# Patient Record
Sex: Female | Born: 1990 | Race: White | Hispanic: No | Marital: Single | Smoking: Never smoker
Health system: Southern US, Community
[De-identification: ages and names within clinical notes are randomized; demographics above are authoritative.]

---
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
## 2011-01-31 ENCOUNTER — Other Ambulatory Visit: Payer: Self-pay | Admitting: Family Medicine

## 2011-01-31 ENCOUNTER — Ambulatory Visit
Admission: RE | Admit: 2011-01-31 | Discharge: 2011-01-31 | Disposition: A | Discharge status: Home / Self Care | Payer: PRIVATE HEALTH INSURANCE | Source: Ambulatory Visit | Attending: Family Medicine | Admitting: Family Medicine

## 2011-01-31 DIAGNOSIS — R110 Nausea: Secondary | ICD-10-CM

## 2011-01-31 DIAGNOSIS — R1013 Epigastric pain: Secondary | ICD-10-CM

## 2011-05-13 LAB — COMPREHENSIVE METABOLIC PANEL
AST: 20 U/L (ref 0–37)
Albumin: 3.9 g/dL (ref 3.5–5.2)
Alkaline Phosphatase: 52 U/L (ref 47–119)
Chloride: 105 mEq/L (ref 96–112)
Potassium: 3.8 mEq/L (ref 3.5–5.1)
Sodium: 141 mEq/L (ref 135–145)
Total Bilirubin: 0.6 mg/dL (ref 0.3–1.2)

## 2011-05-13 LAB — URINALYSIS, ROUTINE W REFLEX MICROSCOPIC
Bilirubin Urine: NEGATIVE
Nitrite: NEGATIVE
Specific Gravity, Urine: 1.025 (ref 1.005–1.030)
pH: 6 (ref 5.0–8.0)

## 2011-05-13 LAB — DIFFERENTIAL
Band Neutrophils: 0 % (ref 0–10)
Basophils Absolute: 0 10*3/uL (ref 0.0–0.1)
Basophils Relative: 0 % (ref 0–1)
Eosinophils Absolute: 0.2 10*3/uL (ref 0.0–1.2)
Eosinophils Relative: 2 % (ref 0–5)
Lymphs Abs: 2.7 10*3/uL (ref 1.1–4.8)
Myelocytes: 0 %
Promyelocytes Absolute: 0 %

## 2011-05-13 LAB — CBC
Platelets: 416 10*3/uL — ABNORMAL HIGH (ref 150–400)
WBC: 9.2 10*3/uL (ref 4.5–13.5)

## 2011-05-13 LAB — OCCULT BLOOD X 1 CARD TO LAB, STOOL: Fecal Occult Bld: POSITIVE

## 2011-05-13 LAB — POCT PREGNANCY, URINE: Preg Test, Ur: NEGATIVE

## 2012-10-15 ENCOUNTER — Ambulatory Visit (INDEPENDENT_AMBULATORY_CARE_PROVIDER_SITE_OTHER): Payer: 59 | Admitting: Physician Assistant

## 2012-10-15 VITALS — BP 104/80 | HR 79 | Temp 98.2°F | Resp 16 | Ht 61.5 in | Wt 148.0 lb

## 2012-10-15 DIAGNOSIS — J3489 Other specified disorders of nose and nasal sinuses: Secondary | ICD-10-CM

## 2012-10-15 DIAGNOSIS — R0981 Nasal congestion: Secondary | ICD-10-CM

## 2012-10-15 DIAGNOSIS — R05 Cough: Secondary | ICD-10-CM

## 2012-10-15 DIAGNOSIS — J029 Acute pharyngitis, unspecified: Secondary | ICD-10-CM

## 2012-10-15 DIAGNOSIS — R059 Cough, unspecified: Secondary | ICD-10-CM

## 2012-10-15 LAB — POCT RAPID STREP A (OFFICE): Rapid Strep A Screen: NEGATIVE

## 2012-10-15 MED ORDER — IPRATROPIUM BROMIDE 0.03 % NA SOLN: 2.0000 | Freq: Two times a day (BID) | NASAL | Status: DC

## 2012-10-15 MED ORDER — AMOXICILLIN 875 MG PO TABS: 875.0000 mg | ORAL_TABLET | Freq: Two times a day (BID) | ORAL | Status: DC

## 2012-10-15 NOTE — Progress Notes (Signed)
  Subjective:    Patient ID: Kristen Collier, female    DOB: 10/16/1990, 22 y.o.   MRN: [id number]  HPI 22 year old female presents with 4 day history of sore throat, nasal congestion, cough, and left ear pain.  States symptoms have progressively worsened over the weekend and her cough has continued to irritate her throat.  Denies fever, chills, nausea, vomiting, headache, dizziness, or sinus pain.  She does have a history of strep throat infections, last one was 1 year ago. No known strep contacts.  No OTC medications tried yet.    Patient otherwise healthy with no other concerns today Takes OCP's as only daily medication.    She is a Consulting civil engineer at [employer].     Review of Systems  Constitutional: Negative for fever and chills.  HENT: Positive for ear pain (left sided), congestion, sore throat, rhinorrhea and postnasal drip.   Respiratory: Positive for cough. Negative for shortness of breath and wheezing.   Gastrointestinal: Negative for nausea, vomiting and abdominal pain.  Neurological: Negative for dizziness and headaches.       Objective:   Physical Exam  Constitutional: She is oriented to person, place, and time. She appears well-developed and well-nourished.  HENT:  Head: Normocephalic and atraumatic.  Right Ear: Hearing, tympanic membrane, external ear and ear canal normal.  Left Ear: Hearing, tympanic membrane, external ear and ear canal normal.  Mouth/Throat: Uvula is midline and mucous membranes are normal. Posterior oropharyngeal erythema present. No oropharyngeal exudate, posterior oropharyngeal edema or tonsillar abscesses.  Eyes: Conjunctivae are normal.  Neck: Normal range of motion.  Cardiovascular: Normal rate, regular rhythm and normal heart sounds.   Pulmonary/Chest: Effort normal and breath sounds normal.  Lymphadenopathy:    She has no cervical adenopathy.  Neurological: She is alert and oriented to person, place, and time.  Psychiatric: She has a  normal mood and affect. Her behavior is normal. Judgment and thought content normal.          Assessment & Plan:  1. Acute pharyngitis - Plan: POCT rapid strep A  -Will go ahead and cover with amoxicillin 875 mg bid  -Ibuprofen as needed for pain 2. Nasal congestion  -Atrovent NS bid to help with congestion 3. Cough  -OTC Delsym bid as needed for cough  -Ok to rx tessalon if cough worsens  -Follow up if symptoms worsen or fail to improve.

## 2013-04-05 ENCOUNTER — Ambulatory Visit (INDEPENDENT_AMBULATORY_CARE_PROVIDER_SITE_OTHER): Payer: 59 | Admitting: Internal Medicine

## 2013-04-05 ENCOUNTER — Ambulatory Visit: Payer: 59

## 2013-04-05 VITALS — BP 110/70 | HR 83 | Temp 98.1°F | Resp 18 | Ht 61.5 in | Wt 158.0 lb

## 2013-04-05 DIAGNOSIS — R1031 Right lower quadrant pain: Secondary | ICD-10-CM

## 2013-04-05 DIAGNOSIS — R300 Dysuria: Secondary | ICD-10-CM

## 2013-04-05 DIAGNOSIS — G8929 Other chronic pain: Secondary | ICD-10-CM

## 2013-04-05 DIAGNOSIS — K5900 Constipation, unspecified: Secondary | ICD-10-CM

## 2013-04-05 DIAGNOSIS — N390 Urinary tract infection, site not specified: Secondary | ICD-10-CM

## 2013-04-05 LAB — POCT UA - MICROSCOPIC ONLY
Casts, Ur, LPF, POC: NEGATIVE
Crystals, Ur, HPF, POC: NEGATIVE
Epithelial cells, urine per micros: NEGATIVE
Mucus, UA: NEGATIVE
RBC, urine, microscopic: NEGATIVE
Yeast, UA: NEGATIVE

## 2013-04-05 LAB — POCT CBC
Granulocyte percent: 59.4 %G (ref 37–80)
HCT, POC: 39.1 % (ref 37.7–47.9)
Hemoglobin: 12.5 g/dL (ref 12.2–16.2)
MCH, POC: 28.5 pg (ref 27–31.2)
MCV: 89 fL (ref 80–97)
MID (cbc): 0.5 (ref 0–0.9)
RBC: 4.39 M/uL (ref 4.04–5.48)
WBC: 8.4 10*3/uL (ref 4.6–10.2)

## 2013-04-05 LAB — POCT URINALYSIS DIPSTICK
Bilirubin, UA: NEGATIVE
Blood, UA: NEGATIVE
Glucose, UA: NEGATIVE
Ketones, UA: NEGATIVE
Nitrite, UA: NEGATIVE
Protein, UA: NEGATIVE
Spec Grav, UA: 1.025
Urobilinogen, UA: 0.2
pH, UA: 7

## 2013-04-05 MED ORDER — CIPROFLOXACIN HCL 500 MG PO TABS: 500.0000 mg | ORAL_TABLET | Freq: Two times a day (BID) | ORAL | Status: DC

## 2013-04-05 NOTE — Progress Notes (Signed)
Subjective:    Patient ID: Kristen Collier, female    DOB: Sep 08, 1990, 22 y.o.   MRN: [id number]  Abdominal Pain Associated symptoms include dysuria.  Dysuria    Has had constipation, painful urination and frequency few days No fever or chills and no hematuria   Review of Systems  Gastrointestinal: Positive for abdominal pain.  Genitourinary: Positive for dysuria.       Objective:   Physical Exam  Constitutional: She is oriented to person, place, and time. She appears well-developed and well-nourished. No distress.  HENT:  Head: Normocephalic.  Eyes: EOM are normal. No scleral icterus.  Pulmonary/Chest: Effort normal and breath sounds normal.  Abdominal: Soft. Bowel sounds are normal. She exhibits no distension and no mass. There is no hepatosplenomegaly. There is tenderness in the right upper quadrant and right lower quadrant. There is CVA tenderness. There is no rigidity, no rebound, no guarding and negative Murphy's sign.    Pain starts in right flank and goes down  Neurological: She is alert and oriented to person, place, and time. No cranial nerve deficit. She exhibits normal muscle tone. Coordination normal.  Skin: No rash noted. She is not diaphoretic.  Psychiatric: She has a normal mood and affect.   Results for orders placed in visit on 04/05/13  POCT UA - MICROSCOPIC ONLY      Result Value Range   WBC, Ur, HPF, POC 3-5     RBC, urine, microscopic neg     Bacteria, U Microscopic trace     Mucus, UA neg     Epithelial cells, urine per micros neg     Crystals, Ur, HPF, POC neg     Casts, Ur, LPF, POC neg     Yeast, UA neg    POCT URINALYSIS DIPSTICK      Result Value Range   Color, UA yellow     Clarity, UA clear     Glucose, UA neg     Bilirubin, UA neg     Ketones, UA neg'     Spec Grav, UA 1.025     Blood, UA neg     pH, UA 7.0     Protein, UA neg     Urobilinogen, UA 0.2     Nitrite, UA neg     Leukocytes, UA small (1+)    POCT CBC      Result  Value Range   WBC 8.4  4.6 - 10.2 K/uL   Lymph, poc 2.9  0.6 - 3.4   POC LYMPH PERCENT 34.7  10 - 50 %L   MID (cbc) 0.5  0 - 0.9   POC MID % 5.9  0 - 12 %M   POC Granulocyte 5.0  2 - 6.9   Granulocyte percent 59.4  37 - 80 %G   RBC 4.39  4.04 - 5.48 M/uL   Hemoglobin 12.5  12.2 - 16.2 g/dL   HCT, POC 40.9  81.1 - 47.9 %   MCV 89.0  80 - 97 fL   MCH, POC 28.5  27 - 31.2 pg   MCHC 32.0  31.8 - 35.4 g/dL   RDW, POC 91.4     Platelet Count, POC 361  142 - 424 K/uL   MPV 8.0  0 - 99.8 fL  POCT URINE PREGNANCY      Result Value Range   Preg Test, Ur Negative     CS urine [facility] reading (PRIMARY) by  Dr.Iliany Losier no free air, no obstruction  Assessment & Plan:  Constipation/UTI Cipro 500mg  bid Miralax/Diet Reck 2 days

## 2013-04-05 NOTE — Patient Instructions (Addendum)
Constipation, Adult °Constipation is when a person has fewer than 3 bowel movements a week; has difficulty having a bowel movement; or has stools that are dry, hard, or larger than normal. As people grow older, constipation is more common. If you try to fix constipation with medicines that make you have a bowel movement (laxatives), the problem may get worse. Long-term laxative use may cause the muscles of the colon to become weak. A low-fiber diet, not taking in enough fluids, and taking certain medicines may make constipation worse. °CAUSES  °· Certain medicines, such as antidepressants, pain medicine, iron supplements, antacids, and water pills.   °· Certain diseases, such as diabetes, irritable bowel syndrome (IBS), thyroid disease, or depression.   °· Not drinking enough water.   °· Not eating enough fiber-rich foods.   °· Stress or travel. °· Lack of physical activity or exercise. °· Not going to the restroom when there is the urge to have a bowel movement. °· Ignoring the urge to have a bowel movement. °· Using laxatives too much. °SYMPTOMS  °· Having fewer than 3 bowel movements a week.   °· Straining to have a bowel movement.   °· Having hard, dry, or larger than normal stools.   °· Feeling full or bloated.   °· Pain in the lower abdomen. °· Not feeling relief after having a bowel movement. °DIAGNOSIS  °Your caregiver will take a medical history and perform a physical exam. Further testing may be done for severe constipation. Some tests may include:  °· A barium enema X-ray to examine your rectum, colon, and sometimes, your small intestine. °· A sigmoidoscopy to examine your lower colon. °· A colonoscopy to examine your entire colon. °TREATMENT  °Treatment will depend on the severity of your constipation and what is causing it. Some dietary treatments include drinking more fluids and eating more fiber-rich foods. Lifestyle treatments may include regular exercise. If these diet and lifestyle recommendations  do not help, your caregiver may recommend taking over-the-counter laxative medicines to help you have bowel movements. Prescription medicines may be prescribed if over-the-counter medicines do not work.  °HOME CARE INSTRUCTIONS  °· Increase dietary fiber in your diet, such as fruits, vegetables, whole grains, and beans. Limit high-fat and processed sugars in your diet, such as French fries, hamburgers, cookies, candies, and soda.   °· A fiber supplement may be added to your diet if you cannot get enough fiber from foods.   °· Drink enough fluids to keep your urine clear or pale yellow.   °· Exercise regularly or as directed by your caregiver.   °· Go to the restroom when you have the urge to go. Do not hold it. °· Only take medicines as directed by your caregiver. Do not take other medicines for constipation without talking to your caregiver first. °SEEK IMMEDIATE MEDICAL CARE IF:  °· You have bright red blood in your stool.   °· Your constipation lasts for more than 4 days or gets worse.   °· You have abdominal or rectal pain.   °· You have thin, pencil-like stools. °· You have unexplained weight loss. °MAKE SURE YOU:  °· Understand these instructions. °· Will watch your condition. °· Will get help right away if you are not doing well or get worse. °Document Released: 04/22/2004 Document Revised: 10/17/2011 Document Reviewed: 06/28/2011 °ExitCare® Patient Information ©2014 [employer]. ° °Urinary Tract Infection °Urinary tract infections (UTIs) can develop anywhere along your urinary tract. Your urinary tract is your body's drainage system for removing wastes and extra water. Your urinary tract includes two kidneys, two   ureters, a bladder, and a urethra. Your kidneys are a pair of bean-shaped organs. Each kidney is about the size of your fist. They are located below your ribs, one on each side of your spine. °CAUSES °Infections are caused by microbes, which are microscopic organisms, including fungi, viruses, and  bacteria. These organisms are so small that they can only be seen through a microscope. Bacteria are the microbes that most commonly cause UTIs. °SYMPTOMS  °Symptoms of UTIs may vary by age and gender of the patient and by the location of the infection. Symptoms in young women typically include a frequent and intense urge to urinate and a painful, burning feeling in the bladder or urethra during urination. Older women and men are more likely to be tired, shaky, and weak and have muscle aches and abdominal pain. A fever may mean the infection is in your kidneys. Other symptoms of a kidney infection include pain in your back or sides below the ribs, nausea, and vomiting. °DIAGNOSIS °To diagnose a UTI, your caregiver will ask you about your symptoms. Your caregiver also will ask to provide a urine sample. The urine sample will be tested for bacteria and white blood cells. White blood cells are made by your body to help fight infection. °TREATMENT  °Typically, UTIs can be treated with medication. Because most UTIs are caused by a bacterial infection, they usually can be treated with the use of antibiotics. The choice of antibiotic and length of treatment depend on your symptoms and the type of bacteria causing your infection. °HOME CARE INSTRUCTIONS °· If you were prescribed antibiotics, take them exactly as your caregiver instructs you. Finish the medication even if you feel better after you have only taken some of the medication. °· Drink enough water and fluids to keep your urine clear or pale yellow. °· Avoid caffeine, tea, and carbonated beverages. They tend to irritate your bladder. °· Empty your bladder often. Avoid holding urine for long periods of time. °· Empty your bladder before and after sexual intercourse. °· After a bowel movement, women should cleanse from front to back. Use each tissue only once. °SEEK MEDICAL CARE IF:  °· You have back pain. °· You develop a fever. °· Your symptoms do not begin to  resolve within 3 days. °SEEK IMMEDIATE MEDICAL CARE IF:  °· You have severe back pain or lower abdominal pain. °· You develop chills. °· You have nausea or vomiting. °· You have continued burning or discomfort with urination. °MAKE SURE YOU:  °· Understand these instructions. °· Will watch your condition. °· Will get help right away if you are not doing well or get worse. °Document Released: 05/04/2005 Document Revised: 01/24/2012 Document Reviewed: 09/02/2011 °ExitCare® Patient Information ©2014 [employer]. ° °

## 2013-04-07 LAB — URINE CULTURE
Colony Count: NO GROWTH
Organism ID, Bacteria: NO GROWTH

## 2013-08-27 ENCOUNTER — Ambulatory Visit (INDEPENDENT_AMBULATORY_CARE_PROVIDER_SITE_OTHER): Payer: 59 | Admitting: Physician Assistant

## 2013-08-27 VITALS — BP 120/84 | HR 86 | Temp 98.4°F | Resp 16 | Ht 61.5 in | Wt 146.2 lb

## 2013-08-27 DIAGNOSIS — R05 Cough: Secondary | ICD-10-CM

## 2013-08-27 DIAGNOSIS — R059 Cough, unspecified: Secondary | ICD-10-CM

## 2013-08-27 DIAGNOSIS — J029 Acute pharyngitis, unspecified: Secondary | ICD-10-CM

## 2013-08-27 DIAGNOSIS — J069 Acute upper respiratory infection, unspecified: Secondary | ICD-10-CM

## 2013-08-27 LAB — POCT RAPID STREP A (OFFICE): Rapid Strep A Screen: NEGATIVE

## 2013-08-27 MED ORDER — IPRATROPIUM BROMIDE 0.06 % NA SOLN: 2.0000 | Freq: Three times a day (TID) | NASAL | Status: AC

## 2013-08-27 MED ORDER — HYDROCODONE-HOMATROPINE 5-1.5 MG/5ML PO SYRP: ORAL_SOLUTION | ORAL | Status: AC

## 2013-08-27 MED ORDER — AZITHROMYCIN 250 MG PO TABS: ORAL_TABLET | ORAL | Status: AC

## 2013-08-27 NOTE — Progress Notes (Signed)
Subjective:    Patient ID: Kristen Collier, female    DOB: 06/11/1991, 23 y.o.   MRN: [id number]  HPI Patient ID: Kristen Collier MRN: [id number], DOB: 11/03/1990, 22 y.o. Date of Encounter: 08/27/2013, 1:38 PM  Primary Physician: No PCP Per Patient  Chief Complaint:  Chief Complaint  Patient presents with  . Sore Throat    X Thursday    HPI: 23 y.o. female presents with a 5 day history of sore throat, nasal congestion, rhinorrhea, sneezing, cough, headache, and fatigue. Afebrile. No chills. Cough is sometimes productive of clear sputum. It sounds productive, but is generally not productive. Some SOB and wheezing. Bilateral otalgia. Normal hearing. No GI complaints. Able to swallow saliva, but hurts to do so. Left greater than right. Decreased appetite secondary to sore throat. Niece was sick with URI recently. She is feeling better now. Patient has tried multiple OTC cold preps without much success.   PMH: History reviewed. No pertinent past medical history.   Home Meds: Prior to Admission medications   Medication Sig Start Date End Date Taking? Authorizing Provider  norethindrone-ethinyl estradiol (JUNEL FE,GILDESS FE,LOESTRIN FE) 1-20 MG-MCG tablet Take 1 tablet by mouth daily.   Yes Historical Provider, MD    Allergies: No Known Allergies  History   Social History  . Marital Status: Single    Spouse Name: N/A    Number of Children: N/A  . Years of Education: N/A   Occupational History  . Not on file.   Social History Main Topics  . Smoking status: Never Smoker   . Smokeless tobacco: Not on file  . Alcohol Use: No  . Drug Use: No  . Sexual Activity: Not on file   Other Topics Concern  . Not on file   Social History Narrative  . No narrative on file      Review of Systems  Constitutional: Positive for appetite change and fatigue. Negative for fever and chills.  HENT: Positive for congestion, ear pain, hearing loss, rhinorrhea, sneezing and sore throat.  Negative for postnasal drip and sinus pressure.   Respiratory: Positive for cough, shortness of breath and wheezing.        Cough is sometimes productive.  Gastrointestinal: Negative for nausea, vomiting and diarrhea.  Musculoskeletal: Negative for myalgias.  Neurological: Positive for headaches.       Objective:   Physical Exam  Physical Exam: Blood pressure 120/84, pulse 86, temperature 98.4 F (36.9 C), temperature source Oral, resp. rate 16, height 5' 1.5" (1.562 m), weight 146 lb 3.2 oz (66.316 kg), last menstrual period 08/09/2013, SpO2 99.00%., Body mass index is 27.18 kg/(m^2). General: Well developed, well nourished, in no acute distress. Head: Normocephalic, atraumatic, eyes without discharge, sclera non-icteric, nares are congested. Bilateral auditory canals clear, TM's are without perforation, pearly grey with reflective cone of light bilaterally. No sinus TTP. Oral cavity moist, dentition normal. Posterior pharynx with post nasal drip and mild erythema. No peritonsillar abscess or tonsillar exudate. Uvula midline.  Neck: Supple. No thyromegaly. Full ROM. Lymph nodes: less than 2 cm AC bilaterally. Lungs: Clear bilaterally to auscultation without wheezes, rales, or rhonchi. Breathing is unlabored. Heart: RRR with S1 S2. No murmurs, rubs, or gallops appreciated. Msk:  Strength and tone normal for age. Extremities: No clubbing or cyanosis. No edema. Neuro: Alert and oriented X 3. Moves all extremities spontaneously. CNII-XII grossly in tact. Psych:  Responds to questions appropriately with a normal affect.   Labs: Results for orders placed in visit  on 08/27/13  POCT RAPID STREP A (OFFICE)      Result Value Range   Rapid Strep A Screen Negative  Negative    Throat culture pending     Assessment & Plan:  23 year old female with URI, cough, and pharyngitis -Azithromycin 250 MG #6 2 po first day then 1 po next 4 days no RF -Atrovent NS 0.06% 2 sprays each nare bid prn #1  no RF -Hycodan #4oz 1 tsp po q 4-6 hours prn cough no RF SED -Await throat culture results -Rest/fluids -Voice rest -Do not pick tonsolith with a Q tip, let them be self expelled. Discussed the risks of this  -RTC precautions   Eula Listen, MHS, PA-C [facility] [address] [place], [place] [address] [phone] [facility] 08/27/2013 1:39 PM

## 2013-08-29 LAB — CULTURE, GROUP A STREP: Organism ID, Bacteria: NORMAL

## 2014-08-20 ENCOUNTER — Emergency Department (HOSPITAL_COMMUNITY)
Admission: EM | Admit: 2014-08-20 | Discharge: 2014-08-20 | Disposition: A | Discharge status: Home / Self Care | Payer: 59 | Source: Home / Self Care | Attending: Emergency Medicine | Admitting: Emergency Medicine

## 2014-08-20 ENCOUNTER — Emergency Department (HOSPITAL_COMMUNITY)
Admission: EM | Admit: 2014-08-20 | Discharge: 2014-08-20 | Disposition: A | Discharge status: Home / Self Care | Payer: 59 | Attending: Emergency Medicine | Admitting: Emergency Medicine

## 2014-08-20 ENCOUNTER — Ambulatory Visit (HOSPITAL_COMMUNITY)
Admission: RE | Admit: 2014-08-20 | Discharge: 2014-08-20 | Disposition: A | Discharge status: Home / Self Care | Payer: 59 | Source: Ambulatory Visit | Attending: Emergency Medicine | Admitting: Emergency Medicine

## 2014-08-20 ENCOUNTER — Other Ambulatory Visit (HOSPITAL_COMMUNITY): Payer: Self-pay | Admitting: Emergency Medicine

## 2014-08-20 ENCOUNTER — Encounter (HOSPITAL_COMMUNITY): Payer: Self-pay

## 2014-08-20 ENCOUNTER — Other Ambulatory Visit: Payer: Self-pay

## 2014-08-20 DIAGNOSIS — S3992XA Unspecified injury of lower back, initial encounter: Secondary | ICD-10-CM | POA: Insufficient documentation

## 2014-08-20 DIAGNOSIS — Z79899 Other long term (current) drug therapy: Secondary | ICD-10-CM | POA: Insufficient documentation

## 2014-08-20 DIAGNOSIS — Y998 Other external cause status: Secondary | ICD-10-CM | POA: Diagnosis not present

## 2014-08-20 DIAGNOSIS — Y92410 Unspecified street and highway as the place of occurrence of the external cause: Secondary | ICD-10-CM | POA: Insufficient documentation

## 2014-08-20 DIAGNOSIS — R531 Weakness: Secondary | ICD-10-CM | POA: Diagnosis not present

## 2014-08-20 DIAGNOSIS — S0990XA Unspecified injury of head, initial encounter: Secondary | ICD-10-CM | POA: Insufficient documentation

## 2014-08-20 DIAGNOSIS — S199XXA Unspecified injury of neck, initial encounter: Secondary | ICD-10-CM | POA: Insufficient documentation

## 2014-08-20 DIAGNOSIS — Y9389 Activity, other specified: Secondary | ICD-10-CM | POA: Diagnosis not present

## 2014-08-20 DIAGNOSIS — R200 Anesthesia of skin: Secondary | ICD-10-CM | POA: Diagnosis not present

## 2014-08-20 DIAGNOSIS — T1490XA Injury, unspecified, initial encounter: Secondary | ICD-10-CM

## 2014-08-20 DIAGNOSIS — F10920 Alcohol use, unspecified with intoxication, uncomplicated: Secondary | ICD-10-CM

## 2014-08-20 DIAGNOSIS — F10120 Alcohol abuse with intoxication, uncomplicated: Secondary | ICD-10-CM | POA: Insufficient documentation

## 2014-08-20 LAB — COMPREHENSIVE METABOLIC PANEL
ALBUMIN: 4.1 g/dL (ref 3.5–5.2)
ALT: 14 U/L (ref 0–35)
ANION GAP: 7 (ref 5–15)
AST: 21 U/L (ref 0–37)
Alkaline Phosphatase: 57 U/L (ref 39–117)
BILIRUBIN TOTAL: 0.4 mg/dL (ref 0.3–1.2)
BUN: 8 mg/dL (ref 6–23)
CHLORIDE: 108 meq/L (ref 96–112)
CO2: 24 mmol/L (ref 19–32)
Calcium: 9.2 mg/dL (ref 8.4–10.5)
Creatinine, Ser: 0.78 mg/dL (ref 0.50–1.10)
GFR calc Af Amer: >90 mL/min (ref >90)
GFR calc non Af Amer: >90 mL/min (ref >90)
Glucose, Bld: 90 mg/dL (ref 70–99)
POTASSIUM: 3.8 mmol/L (ref 3.5–5.1)
Sodium: 139 mmol/L (ref 135–145)
Total Protein: 7.4 g/dL (ref 6.0–8.3)

## 2014-08-20 LAB — CBC WITH DIFFERENTIAL/PLATELET
BASOS ABS: 0.1 10*3/uL (ref 0.0–0.1)
Basophils Relative: 1 % (ref 0–1)
EOS PCT: 1 % (ref 0–5)
Eosinophils Absolute: 0.1 10*3/uL (ref 0.0–0.7)
HCT: 39.6 % (ref 36.0–46.0)
Hemoglobin: 13.2 g/dL (ref 12.0–15.0)
LYMPHS ABS: 3 10*3/uL (ref 0.7–4.0)
Lymphocytes Relative: 30 % (ref 12–46)
MCH: 30 pg (ref 26.0–34.0)
MCHC: 33.3 g/dL (ref 30.0–36.0)
MCV: 90 fL (ref 78.0–100.0)
Monocytes Absolute: 0.8 10*3/uL (ref 0.1–1.0)
Monocytes Relative: 8 % (ref 3–12)
Neutro Abs: 6.1 10*3/uL (ref 1.7–7.7)
Neutrophils Relative %: 61 % (ref 43–77)
PLATELETS: 323 10*3/uL (ref 150–400)
RBC: 4.4 MIL/uL (ref 3.87–5.11)
RDW: 13.3 % (ref 11.5–15.5)
WBC: 10.1 10*3/uL (ref 4.0–10.5)

## 2014-08-20 LAB — ETHANOL: Alcohol, Ethyl (B): 178 mg/dL — ABNORMAL HIGH (ref 0–9)

## 2014-08-20 LAB — PROTIME-INR
INR: 0.9 (ref 0.00–1.49)
Prothrombin Time: 12.2 seconds (ref 11.6–15.2)

## 2014-08-20 LAB — I-STAT CHEM 8, ED
BUN: 8 mg/dL (ref 6–23)
Calcium, Ion: 1.18 mmol/L (ref 1.12–1.23)
Chloride: 110 mEq/L (ref 96–112)
Creatinine, Ser: 1 mg/dL (ref 0.50–1.10)
Glucose, Bld: 91 mg/dL (ref 70–99)
HCT: 44 % (ref 36.0–46.0)
HEMOGLOBIN: 15 g/dL (ref 12.0–15.0)
Potassium: 3.9 mmol/L (ref 3.5–5.1)
SODIUM: 144 mmol/L (ref 135–145)
TCO2: 21 mmol/L (ref 0–100)

## 2014-08-20 LAB — I-STAT BETA HCG BLOOD, ED (MC, WL, AP ONLY): I-stat hCG, quantitative: 11.4 m[IU]/mL — ABNORMAL HIGH (ref <5)

## 2014-08-20 LAB — SAMPLE TO BLOOD BANK

## 2014-08-20 LAB — LACTATE DEHYDROGENASE: LDH: 207 U/L (ref 94–250)

## 2014-08-20 MED ORDER — IBUPROFEN 600 MG PO TABS: 600.0000 mg | ORAL_TABLET | Freq: Four times a day (QID) | ORAL | Status: DC | PRN

## 2014-08-20 MED ORDER — METHOCARBAMOL 500 MG PO TABS: 1000.0000 mg | ORAL_TABLET | Freq: Three times a day (TID) | ORAL | Status: AC | PRN

## 2014-08-20 MED ORDER — TRAMADOL HCL 50 MG PO TABS: 50.0000 mg | ORAL_TABLET | Freq: Four times a day (QID) | ORAL | Status: AC | PRN

## 2014-08-20 MED ORDER — IOHEXOL 300 MG/ML  SOLN
100.0000 mL | Freq: Once | INTRAMUSCULAR | Status: AC | PRN
  Administered 2014-08-20: 100 mL via INTRAVENOUS
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
## 2014-08-20 NOTE — ED Notes (Signed)
Pt appears more alert, sitting up in bed, smiling and talking with family.

## 2014-08-20 NOTE — ED Notes (Signed)
Previous charting, see downtime charting.
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
## 2015-02-04 ENCOUNTER — Other Ambulatory Visit: Payer: Self-pay | Admitting: Obstetrics and Gynecology

## 2015-02-05 LAB — CYTOLOGY - PAP

## 2017-07-16 ENCOUNTER — Emergency Department (HOSPITAL_COMMUNITY): Payer: Managed Care, Other (non HMO)

## 2017-07-16 ENCOUNTER — Other Ambulatory Visit: Payer: Self-pay

## 2017-07-16 ENCOUNTER — Emergency Department (HOSPITAL_COMMUNITY)
Admission: EM | Admit: 2017-07-16 | Discharge: 2017-07-16 | Disposition: A | Discharge status: Home / Self Care | Payer: Managed Care, Other (non HMO) | Attending: Emergency Medicine | Admitting: Emergency Medicine

## 2017-07-16 ENCOUNTER — Encounter (HOSPITAL_COMMUNITY): Payer: Self-pay

## 2017-07-16 DIAGNOSIS — S60221A Contusion of right hand, initial encounter: Secondary | ICD-10-CM | POA: Insufficient documentation

## 2017-07-16 DIAGNOSIS — S6991XA Unspecified injury of right wrist, hand and finger(s), initial encounter: Secondary | ICD-10-CM | POA: Diagnosis present

## 2017-07-16 DIAGNOSIS — Z79899 Other long term (current) drug therapy: Secondary | ICD-10-CM | POA: Insufficient documentation

## 2017-07-16 DIAGNOSIS — Y9389 Activity, other specified: Secondary | ICD-10-CM | POA: Insufficient documentation

## 2017-07-16 DIAGNOSIS — Y929 Unspecified place or not applicable: Secondary | ICD-10-CM | POA: Diagnosis not present

## 2017-07-16 DIAGNOSIS — W2209XA Striking against other stationary object, initial encounter: Secondary | ICD-10-CM | POA: Diagnosis not present

## 2017-07-16 DIAGNOSIS — R200 Anesthesia of skin: Secondary | ICD-10-CM | POA: Diagnosis not present

## 2017-07-16 DIAGNOSIS — Y998 Other external cause status: Secondary | ICD-10-CM | POA: Insufficient documentation

## 2017-07-16 MED ORDER — NAPROXEN 375 MG PO TABS: 375.0000 mg | ORAL_TABLET | Freq: Two times a day (BID) | ORAL | 0 refills | Status: AC

## 2017-07-16 MED ORDER — IBUPROFEN 800 MG PO TABS
800.0000 mg | ORAL_TABLET | Freq: Once | ORAL | Status: AC
  Administered 2017-07-16: 800 mg via ORAL
  Filled 2017-07-16: qty 1

## 2017-07-16 NOTE — ED Triage Notes (Addendum)
Pt is alert and oriented x 4 and verbally responsive.Pt presents with a swollen rt hand in which pt reports that she punched a wall last night 9/10 throbbing/ sharp ian that radiates up arm. Pt has some bruising noted to her rt hand. +PMS , radial pulse moderate

## 2017-07-16 NOTE — ED Provider Notes (Signed)
[facility]-EMERGENCY DEPT Provider Note   CSN: [id number] Arrival date & time: 07/16/17  2120     History   Chief Complaint Chief Complaint  Patient presents with  . Hand Injury    HPI Ladora DanielCarmen M Dimiceli is a 26 y.o. female.  Ladora DanielCarmen M Boroff is a 26 y.o. Female who presents to the emergency department complaining of right hand pain following an injury last night.  Patient reports she punched a wall yesterday evening.  She reports gradually worsening swelling and pain.  She reports earlier today she felt like her right fifth finger became numb.  She reports she cannot feel her right fifth finger.  She took ibuprofen around 10:30 AM this morning with little relief of her symptoms.  No other treatments attempted.  She is right-hand dominant. She denies wrist pain or other injury.    The history is provided by the patient and medical records. No language interpreter was used.  Hand Injury   Pertinent negatives include no fever.    History reviewed. No pertinent past medical history.  There are no active problems to display for this patient.   History reviewed. No pertinent surgical history.  OB History    No data available       Home Medications    Prior to Admission medications   Medication Sig Start Date End Date Taking? Authorizing Provider  azithromycin (ZITHROMAX Z-PAK) 250 MG tablet 2 tabs po first day, then 1 tab po next 4 days 08/27/13   Sondra Bargesunn, Ryan M, PA-C  HYDROcodone-homatropine [facility](HYCODAN) 5-1.5 MG/5ML syrup 1 TSP PO Q 4-6 HOURS PRN COUGH 08/27/13   Sondra Bargesunn, Ryan M, PA-C  ipratropium (ATROVENT) 0.06 % nasal spray Place 2 sprays into the nose 3 (three) times daily. 08/27/13   Dunn, Raymon Muttonyan M, PA-C  methocarbamol (ROBAXIN) 500 MG tablet Take 2 tablets (1,000 mg total) by mouth every 8 (eight) hours as needed for muscle spasms. 08/20/14   Loren RacerYelverton, David, MD  naproxen (NAPROSYN) 375 MG tablet Take 1 tablet (375 mg total) by mouth 2 (two) times daily  with a meal. 07/16/17   Everlene Farrieransie, Octave Montrose, PA-C  norethindrone-ethinyl estradiol (JUNEL FE,GILDESS FE,LOESTRIN FE) 1-20 MG-MCG tablet Take 1 tablet by mouth daily.    [provider]  traMADol (ULTRAM) 50 MG tablet Take 1 tablet (50 mg total) by mouth every 6 (six) hours as needed. 08/20/14   Loren RacerYelverton, David, MD    Family History Family History  Problem Relation Age of Onset  . Heart disease Maternal Grandmother   . Diabetes Maternal Grandmother   . Heart disease Paternal Grandmother   . Cancer Paternal Grandfather   . Cancer Other     Social History Social History   Tobacco Use  . Smoking status: Never Smoker  . Smokeless tobacco: Never Used  Substance Use Topics  . Alcohol use: No  . Drug use: No     Allergies   Patient has no known allergies.   Review of Systems Review of Systems  Constitutional: Negative for fever.  Musculoskeletal: Positive for arthralgias and joint swelling.  Skin: Positive for color change. Negative for rash and wound.  Neurological: Positive for numbness. Negative for weakness.     Physical Exam Updated Vital Signs BP (!) 119/92 (BP Location: Right Leg)   Pulse 76   Temp 97.6 F (36.4 C) (Oral)   Resp 20   Ht 5\' 3"  (1.6 m)   Wt 68.9 kg (152 lb)   LMP 07/07/2017  SpO2 100%   BMI 26.93 kg/m   Physical Exam  Constitutional: She appears well-developed and well-nourished. No distress.  HENT:  Head: Normocephalic and atraumatic.  Eyes: Right eye exhibits no discharge. Left eye exhibits no discharge.  Cardiovascular: Normal rate, regular rhythm and intact distal pulses.  Bilateral radial pulses are intact.  Good capillary refill to her distal fingertips bilaterally.  Pulmonary/Chest: Effort normal. No respiratory distress.  Musculoskeletal: She exhibits edema and tenderness. She exhibits no deformity.  There is mild ecchymosis noted to the dorsum of her right hand with associated edema.  Tenderness over her right third, fourth  and fifth MCP joints.  No gross deformity.  No broken skin.  Good capillary refill to her fingertips. Good ROM of her right wrist. No right wrist TTP.   Neurological: She is alert. A sensory deficit is present. Coordination normal.  Skin: Skin is warm and dry. Capillary refill takes less than 2 seconds. No rash noted. She is not diaphoretic. No erythema. No pallor.  Psychiatric: She has a normal mood and affect. Her behavior is normal.  Nursing note and vitals reviewed.    ED Treatments / Results  Labs (all labs ordered are listed, but only abnormal results are displayed) Labs Reviewed - No data to display  EKG  EKG Interpretation None       Radiology Dg Hand Complete Right  Result Date: 07/16/2017 CLINICAL DATA:  Pain and swelling after punching a wall yesterday. EXAM: RIGHT HAND - COMPLETE 3+ VIEW COMPARISON:  None. FINDINGS: Tiny bony fragment in dorsum of metacarpal-carpal joint space only seen on lateral radiograph. No donor site. No dislocation. There is no evidence of arthropathy or other focal bone abnormality. Dorsal hand soft tissue swelling without subcutaneous gas or radiopaque foreign bodies. IMPRESSION: Tiny possibly acute fracture fragment dorsum of wrist. No dislocation. Electronically Signed   By: Awilda Metroourtnay  Bloomer M.D.   On: 07/16/2017 21:54    Procedures Procedures (including critical care time)  Medications Ordered in ED Medications  ibuprofen (ADVIL,MOTRIN) tablet 800 mg (800 mg Oral Given 07/16/17 2225)     Initial Impression / Assessment and Plan / ED Course  I have reviewed the triage vital signs and the nursing notes.  Pertinent labs & imaging results that were available during my care of the patient were reviewed by me and considered in my medical decision making (see chart for details).    This  is a 26 y.o. Female who presents to the emergency department complaining of right hand pain following an injury last night.  Patient reports she punched a  wall yesterday evening.  She reports gradually worsening swelling and pain.  She reports earlier today she felt like her right fifth finger became numb.  She reports she cannot feel her right fifth finger.  She took ibuprofen around 10:30 AM this morning with little relief of her symptoms.  No other treatments attempted.  She is right-hand dominant. She denies wrist pain or other injury.  On exam the patient is afebrile nontoxic-appearing.  She is significant soft tissue swelling to the dorsum of her right hand with slight ecchymosis.  She is good range of motion at her right wrist without any difficulty or pain.  No right wrist tenderness to palpation.  Good capillary refill to her fingertips on her right hand.  She reports decreased sensation with palpation of her right fifth finger.  She has good range of motion of her fingers.  Knuckles are in alignment.  Good right  radial pulse. Right hand x-ray shows a tiny possibly acute fracture fragment of the dorsum of the wrist.  No dislocation.  Patient has no wrist tenderness and is mostly tender towards her MCP joints.  She has good range of motion at her wrist.  I did call and discuss the x-ray with radiology.  We will place the patient and ulnar gutter splint and have her follow-up with orthopedic hand surgery.  I encouraged her to try and reduce the swelling with elevation, ice and naproxen for pain control.  The numbness/tingling feeling is likely related to trauma and swelling.  I discussed return precautions. I advised the patient to follow-up with their primary care provider this week. I advised the patient to return to the emergency department with new or worsening symptoms or new concerns. The patient verbalized understanding and agreement with plan.      Final Clinical Impressions(s) / ED Diagnoses   Final diagnoses:  Contusion of right hand, initial encounter    ED Discharge Orders        Ordered    naproxen (NAPROSYN) 375 MG tablet  2 times  daily with meals     07/16/17 2217       Everlene Farrier, PA-C 07/16/17 2228    Vanetta Mulders, MD 07/16/17 2244

## 2017-07-20 ENCOUNTER — Encounter (INDEPENDENT_AMBULATORY_CARE_PROVIDER_SITE_OTHER): Payer: Self-pay | Admitting: Physician Assistant

## 2017-07-20 ENCOUNTER — Ambulatory Visit (INDEPENDENT_AMBULATORY_CARE_PROVIDER_SITE_OTHER): Payer: Managed Care, Other (non HMO) | Admitting: Physician Assistant

## 2017-07-20 DIAGNOSIS — S6000XA Contusion of unspecified finger without damage to nail, initial encounter: Secondary | ICD-10-CM | POA: Diagnosis not present

## 2017-07-20 NOTE — Progress Notes (Signed)
Office Visit Note   Patient: Kristen Collier           Date of Birth: 05/26/1991           MRN: [id number] Visit Date: 07/20/2017              Requested by: No referring provider defined for this encounter. PCP: Patient, No Pcp Per   Assessment & Plan: Visit Diagnoses:  1. Contusion of finger of right hand, unspecified finger, initial encounter     Plan: Placed her with a dorsal splint over the fourth right finger she is buddy tape the fourth and fifth fingers.  She can take this off for hygiene purposes only.  Otherwise she is to wear at all times.  We will see her back on Thursday, December 20 at that time we will obtain AP lateral oblique views of the fourth right finger.  Questions encouraged and answered at length today.  Given her clinical exam and the slight lucency through the fourth phalangeal proximal shaft that we would treat this as if it were fracture.  Follow-Up Instructions: Return in about 7 days (around 07/27/2017) for Radiographs.   Orders:  No orders of the defined types were placed in this encounter.  No orders of the defined types were placed in this encounter.     Procedures: No procedures performed   Clinical Data: No additional findings.   Subjective: Chief Complaint  Patient presents with  . Right Hand - Pain    HPI Kristen Collier is a 26 year old female who was at work this past Saturday and hit in office door with her right hand.  She had pain in the hand since that time.  [facility] where radiographs were obtained of her right hand and wrist.  She is placed in a ulnar gutter splint and is to follow-up with orthopedics she is doing today.  States she is having some numbness in her fingers at times.  Difficulty sleeping.  Taking naproxen or ibuprofen for pain. Radiographs of the right hand 3 views are reviewed personally by myself and there is some question of a possible mid fourth proximal phalangeal shaft fracture  nondisplaced versus an overlying shadow.  Radiologist read it the hand films with the possibility of acute fracture fragment dorsal of the wrist.  I explained to her that  Review of Systems See HPI otherwise negative  Objective: Vital Signs: LMP 07/07/2017   Physical Exam  Constitutional: She is oriented to person, place, and time. She appears well-developed and well-nourished. No distress.  Pulmonary/Chest: Effort normal.  Neurological: She is alert and oriented to person, place, and time.  Skin: She is not diaphoretic.  Psychiatric: She has a normal mood and affect.    Ortho Exam  Right hand radial pulse 2plus.  No malrotation at the metacarpal phalangeal joints.  She does have swelling dorsally through the mid hand and particularly the third and fourth fingers.  There is some ecchymosis.  There is no rashes skin lesions ulcerations.  She is able to bend the thumb index and middle finger without any pain and she has limited range of motion of the fourth and fifth fingers.  Sensation grossly intact throughout the hand.  No tenderness at the wrist.  Maximal tenderness ring finger proximal phalange.  Specialty Comments:  No specialty comments available.  Imaging: No results found.   PMFS History: There are no active problems to display for this patient.  History reviewed.  No pertinent past medical history.  Family History  Problem Relation Age of Onset  . Heart disease Maternal Grandmother   . Diabetes Maternal Grandmother   . Heart disease Paternal Grandmother   . Cancer Paternal Grandfather   . Cancer Other     History reviewed. No pertinent surgical history. Social History   Occupational History  . Not on file  Tobacco Use  . Smoking status: Never Smoker  . Smokeless tobacco: Never Used  Substance and Sexual Activity  . Alcohol use: No  . Drug use: No  . Sexual activity: Not on file

## 2017-07-27 ENCOUNTER — Encounter (INDEPENDENT_AMBULATORY_CARE_PROVIDER_SITE_OTHER): Payer: Self-pay | Admitting: Physician Assistant

## 2017-07-27 ENCOUNTER — Ambulatory Visit (INDEPENDENT_AMBULATORY_CARE_PROVIDER_SITE_OTHER): Payer: Managed Care, Other (non HMO) | Admitting: Physician Assistant

## 2017-07-27 ENCOUNTER — Ambulatory Visit (INDEPENDENT_AMBULATORY_CARE_PROVIDER_SITE_OTHER): Payer: Managed Care, Other (non HMO)

## 2017-07-27 DIAGNOSIS — M79641 Pain in right hand: Secondary | ICD-10-CM | POA: Diagnosis not present

## 2017-07-27 NOTE — Progress Notes (Signed)
   Office Visit Note   Patient: Kristen Collier           Date of Birth: 07/24/1991           MRN: [id number] Visit Date: 07/27/2017              Requested by: No referring provider defined for this encounter. PCP: Patient, No Pcp Per   Assessment & Plan: Visit Diagnoses:  1. Right hand pain     Plan: She is to work on range of motion of the hand and wrist.  We have her stiffness does not resolve she will call the office we will send her to formal physical therapy.  Advised her that the stiffness and soreness may take up to 3 months to totally resolve.  Follow-Up Instructions: Return if symptoms worsen or fail to improve.   Orders:  Orders Placed This Encounter  Procedures  . XR Finger Ring Right   No orders of the defined types were placed in this encounter.     Procedures: No procedures performed   Clinical Data: No additional findings.   Subjective: Chief Complaint  Patient presents with  . Right Hand - Pain, Follow-up    HPI Kristen Collier returns today for follow-up of her right fourth finger contusion.  She states overall she is doing a little better she can actually bend the finger more.  She does have some stiffness of the wrist and finger with certain movements.  She is taking naproxen for pain. Review of Systems Review of systems is negative outside the HPI  Objective: Vital Signs: LMP 07/07/2017   Physical Exam Well-developed well-nourished female in no acute distress mood and affect appropriate Ortho Exam Right wrist good range of motion with volar flexion slightly limited dorsiflexion.  She is able to make a fist today she is still tender over the fourth finger middle phalanx.  There is some slight bruising in this area.  Sensation grossly intact.  Out the fingers.  She has full supination pronation of the forearm.  Radial pulses intact. Specialty Comments:  No specialty comments available.  Imaging: Xr Finger Ring Right  Result Date:  07/27/2017 Right finger: 3 views show no acute fracture no bony abnormalities.  There is no evidence of consolidation from a occult fracture.  The fourth finger is well located.  No acute fracture of the wrist is noted on the 3 views today.    PMFS History: There are no active problems to display for this patient.  History reviewed. No pertinent past medical history.  Family History  Problem Relation Age of Onset  . Heart disease Maternal Grandmother   . Diabetes Maternal Grandmother   . Heart disease Paternal Grandmother   . Cancer Paternal Grandfather   . Cancer Other     History reviewed. No pertinent surgical history. Social History   Occupational History  . Not on file  Tobacco Use  . Smoking status: Never Smoker  . Smokeless tobacco: Never Used  Substance and Sexual Activity  . Alcohol use: No  . Drug use: No  . Sexual activity: Not on file

## 2019-07-08 ENCOUNTER — Emergency Department (HOSPITAL_COMMUNITY)
Admission: EM | Admit: 2019-07-08 | Discharge: 2019-07-08 | Disposition: A | Discharge status: Home / Self Care | Payer: BLUE CROSS/BLUE SHIELD | Attending: Emergency Medicine | Admitting: Emergency Medicine

## 2019-07-08 ENCOUNTER — Encounter (HOSPITAL_COMMUNITY): Payer: Self-pay | Admitting: Emergency Medicine

## 2019-07-08 ENCOUNTER — Emergency Department (HOSPITAL_COMMUNITY): Payer: BLUE CROSS/BLUE SHIELD

## 2019-07-08 ENCOUNTER — Other Ambulatory Visit: Payer: Self-pay

## 2019-07-08 DIAGNOSIS — W458XXA Other foreign body or object entering through skin, initial encounter: Secondary | ICD-10-CM | POA: Diagnosis not present

## 2019-07-08 DIAGNOSIS — Z79899 Other long term (current) drug therapy: Secondary | ICD-10-CM | POA: Insufficient documentation

## 2019-07-08 DIAGNOSIS — M79645 Pain in left finger(s): Secondary | ICD-10-CM

## 2019-07-08 DIAGNOSIS — Z23 Encounter for immunization: Secondary | ICD-10-CM | POA: Insufficient documentation

## 2019-07-08 DIAGNOSIS — Y999 Unspecified external cause status: Secondary | ICD-10-CM | POA: Insufficient documentation

## 2019-07-08 DIAGNOSIS — Y93G1 Activity, food preparation and clean up: Secondary | ICD-10-CM | POA: Diagnosis not present

## 2019-07-08 DIAGNOSIS — Y929 Unspecified place or not applicable: Secondary | ICD-10-CM | POA: Insufficient documentation

## 2019-07-08 DIAGNOSIS — S61215A Laceration without foreign body of left ring finger without damage to nail, initial encounter: Secondary | ICD-10-CM | POA: Diagnosis present

## 2019-07-08 MED ORDER — IBUPROFEN 800 MG PO TABS: 800.0000 mg | ORAL_TABLET | Freq: Once | ORAL | Status: DC

## 2019-07-08 MED ORDER — TETANUS-DIPHTH-ACELL PERTUSSIS 5-2.5-18.5 LF-MCG/0.5 IM SUSP
0.5000 mL | Freq: Once | INTRAMUSCULAR | Status: AC
  Administered 2019-07-08: 0.5 mL via INTRAMUSCULAR
  Filled 2019-07-08: qty 0.5

## 2019-07-08 MED ORDER — IBUPROFEN 800 MG PO TABS
800.0000 mg | ORAL_TABLET | Freq: Once | ORAL | Status: AC
  Administered 2019-07-08: 800 mg via ORAL
  Filled 2019-07-08: qty 1

## 2019-07-08 MED ORDER — LIDOCAINE HCL (PF) 1 % IJ SOLN
5.0000 mL | Freq: Once | INTRAMUSCULAR | Status: AC
  Administered 2019-07-08: 5 mL via INTRADERMAL
  Filled 2019-07-08: qty 30

## 2019-07-08 NOTE — ED Triage Notes (Signed)
Laceration to left ring finger from opening can an hour ago; bleeding controlled with pressure dressing.

## 2019-07-08 NOTE — Discharge Instructions (Addendum)
Tylenol or ibuprofen as needed for discomfort.  Sutures will need to be removed in 10 days.  You may go to your primary care provider, urgent care, or ED.  Return to the ED or seek medical attention for any new or worsening symptoms.

## 2019-07-08 NOTE — ED Provider Notes (Signed)
[facility]-EMERGENCY DEPT Provider Note   CSN: [id number] Arrival date & time: 07/08/19  1442     History   Chief Complaint Chief Complaint  Patient presents with  . Laceration    HPI Kristen Collier is a 28 y.o. female with no significant past medical history presents to the ED for a laceration to the volar aspect of her left fourth finger.  Patient reportedly was opening a can of soup that subsequently sliced her finger.  She initially went to the urgent care who subsequently referred her to the ED for further evaluation given concerns of tendon or ligament injury.  Patient reports intermittent feelings of pain and numbness.  She believes she is overdue for her tetanus booster. Last menses was last week and she denies chance of pregnancy.     HPI  History reviewed. No pertinent past medical history.  There are no active problems to display for this patient.   History reviewed. No pertinent surgical history.   OB History   No obstetric history on file.      Home Medications    Prior to Admission medications   Medication Sig Start Date End Date Taking? Authorizing Provider  azithromycin (ZITHROMAX Z-PAK) 250 MG tablet 2 tabs po first day, then 1 tab po next 4 days 08/27/13   Sondra Bargesunn, Ryan M, PA-C  HYDROcodone-homatropine [facility](HYCODAN) 5-1.5 MG/5ML syrup 1 TSP PO Q 4-6 HOURS PRN COUGH 08/27/13   Sondra Bargesunn, Ryan M, PA-C  ipratropium (ATROVENT) 0.06 % nasal spray Place 2 sprays into the nose 3 (three) times daily. 08/27/13   Dunn, Raymon Muttonyan M, PA-C  methocarbamol (ROBAXIN) 500 MG tablet Take 2 tablets (1,000 mg total) by mouth every 8 (eight) hours as needed for muscle spasms. 08/20/14   Loren RacerYelverton, David, MD  naproxen (NAPROSYN) 375 MG tablet Take 1 tablet (375 mg total) by mouth 2 (two) times daily with a meal. 07/16/17   Everlene Farrieransie, William, PA-C  norethindrone-ethinyl estradiol (JUNEL FE,GILDESS FE,LOESTRIN FE) 1-20 MG-MCG tablet Take 1 tablet by mouth daily.    [provider]  traMADol (ULTRAM) 50 MG tablet Take 1 tablet (50 mg total) by mouth every 6 (six) hours as needed. 08/20/14   Loren RacerYelverton, David, MD    Family History Family History  Problem Relation Age of Onset  . Heart disease Maternal Grandmother   . Diabetes Maternal Grandmother   . Heart disease Paternal Grandmother   . Cancer Paternal Grandfather   . Cancer Other     Social History Social History   Tobacco Use  . Smoking status: Never Smoker  . Smokeless tobacco: Never Used  Substance Use Topics  . Alcohol use: Yes    Comment: 1-2 a week  . Drug use: No     Allergies   Patient has no known allergies.   Review of Systems Review of Systems   Physical Exam Updated Vital Signs BP 118/83 (BP Location: Right Arm)   Pulse 85   Temp (!) 97.4 F (36.3 C) (Oral)   Resp 18   Ht 5\' 3"  (1.6 m)   Wt 65.8 kg   LMP 07/01/2019   SpO2 99%   BMI 25.69 kg/m   Physical Exam Vitals signs and nursing note reviewed. Exam conducted with a chaperone present.  Constitutional:      Appearance: Normal appearance.  HENT:     Head: Normocephalic and atraumatic.  Eyes:     General: No scleral icterus.    Conjunctiva/sclera: Conjunctivae normal.  Cardiovascular:  Rate and Rhythm: Normal rate and regular rhythm.     Pulses: Normal pulses.     Heart sounds: Normal heart sounds.  Pulmonary:     Effort: Pulmonary effort is normal.     Breath sounds: Normal breath sounds.  Musculoskeletal:     Comments: Left hand: Laceration to volar aspect left fourth finger near DIP.  Approximately 3 cm length.  Sensation intact both distally and proximally.  Flexion and extension intact, but limited due to pain and discomfort. Strength of flexion and extension intact, against resistance. Ring removed.  No significant swelling or surrounding erythema.  Hemostasis achieved with compression.  Skin:    General: Skin is dry.     Capillary Refill: Capillary refill takes less than 2 seconds.   Neurological:     General: No focal deficit present.     Mental Status: She is alert.     GCS: GCS eye subscore is 4. GCS verbal subscore is 5. GCS motor subscore is 6.     Sensory: No sensory deficit.  Psychiatric:        Mood and Affect: Mood normal.        Behavior: Behavior normal.        Thought Content: Thought content normal.           ED Treatments / Results  Labs (all labs ordered are listed, but only abnormal results are displayed) Labs Reviewed  POC URINE PREG, ED    EKG None  Radiology Dg Finger Ring Left  Result Date: 07/08/2019 CLINICAL DATA:  28 year old female with laceration and left ring finger pain. EXAM: LEFT RING FINGER 2+V COMPARISON:  None. FINDINGS: There is no acute fracture or dislocation. The bones are well mineralized. No arthritic changes. Laceration of the volar aspect of the distal fourth digit. No radiopaque foreign object or soft tissue gas. IMPRESSION: Negative. Electronically Signed   By: Elgie Collard M.D.   On: 07/08/2019 19:24    Procedures .Marland KitchenLaceration Repair  Date/Time: 07/08/2019 8:23 PM Performed by: Lorelee New, PA-C Authorized by: Lorelee New, PA-C   Consent:    Consent obtained:  Verbal   Consent given by:  Patient   Risks discussed:  Infection, need for additional repair, poor cosmetic result, poor wound healing, tendon damage, vascular damage and nerve damage   Alternatives discussed:  No treatment and delayed treatment Universal protocol:    Procedure explained and questions answered to patient or proxy's satisfaction: yes     Relevant documents present and verified: yes     Test results available and properly labeled: yes     Imaging studies available: yes     Required blood products, implants, devices, and special equipment available: yes     Site/side marked: yes     Immediately prior to procedure, a time out was called: yes     Patient identity confirmed:  Verbally with patient Anesthesia (see  MAR for exact dosages):    Anesthesia method:  Local infiltration   Local anesthetic:  Lidocaine 1% w/o epi Laceration details:    Location:  Finger   Finger location:  L ring finger   Length (cm):  3 Repair type:    Repair type:  Simple Pre-procedure details:    Preparation:  Patient was prepped and draped in usual sterile fashion Exploration:    Hemostasis achieved with:  Direct pressure   Wound exploration: wound explored through full range of motion   Treatment:    Area cleansed with:  Saline   Amount of cleaning:  Extensive   Irrigation solution:  Sterile saline   Irrigation volume:  250 mL   Irrigation method:  Pressure wash Skin repair:    Repair method:  Sutures   Suture size:  4-0   Suture material:  Prolene   Suture technique:  Simple interrupted   Number of sutures:  4 Post-procedure details:    Dressing:  Sterile dressing   Patient tolerance of procedure:  Tolerated well, no immediate complications   (including critical care time)  Medications Ordered in ED Medications  Tdap (BOOSTRIX) injection 0.5 mL (0.5 mLs Intramuscular Given 07/08/19 1923)  lidocaine (PF) (XYLOCAINE) 1 % injection 5 mL (5 mLs Intradermal Given by Other 07/08/19 1924)  ibuprofen (ADVIL) tablet 800 mg (800 mg Oral Given 07/08/19 1923)     Initial Impression / Assessment and Plan / ED Course  I have reviewed the triage vital signs and the nursing notes.  Pertinent labs & imaging results that were available during my care of the patient were reviewed by me and considered in my medical decision making (see chart for details).        DG left finger demonstrate no evidence of foreign body or joint damage.  Boostrix injection provided.  Before and after the procedure the motor function, strength, sensation, and circulatory function was assessed.  After the procedure patient had intact motor function with 5/5 strength (equal to unaffected side) to all joints of the left hand fourth finger  with the only sensory changes attributable to the local anesthetic.  Removed patient's ring on affected finger.  Sensation intact distally and proximally. Flexion and extension against resistance intact. Strict return precautions discussed.  We will have her follow-up with hand surgeon for further evaluation and management.  Recommend Tylenol and ibuprofen for pain, as needed.  Provided patient with instructions on sutured wound care.  Return precautions discussed.  All of the evaluation and work-up results were discussed with the patient and any family at bedside. They were provided opportunity to ask any additional questions and have none at this time. They have expressed understanding of verbal discharge instructions as well as return precautions and are agreeable to the plan.    Final Clinical Impressions(s) / ED Diagnoses   Final diagnoses:  Laceration of left ring finger without foreign body without damage to nail, initial encounter    ED Discharge Orders    None       Reita Chard 07/08/19 2036    Isla Pence, MD 07/08/19 2220

## 2019-07-08 NOTE — ED Notes (Signed)
Patient ambulatory to radiology.

## 2019-08-25 IMAGING — CR DG HAND COMPLETE 3+V*R*
3 series · 3 of 3 positions shown · non-contrast
Comparison: None.

CLINICAL DATA: Pain and swelling after punching a wall yesterday.

EXAM:
RIGHT HAND - COMPLETE 3+ VIEW

[x hand pa right]
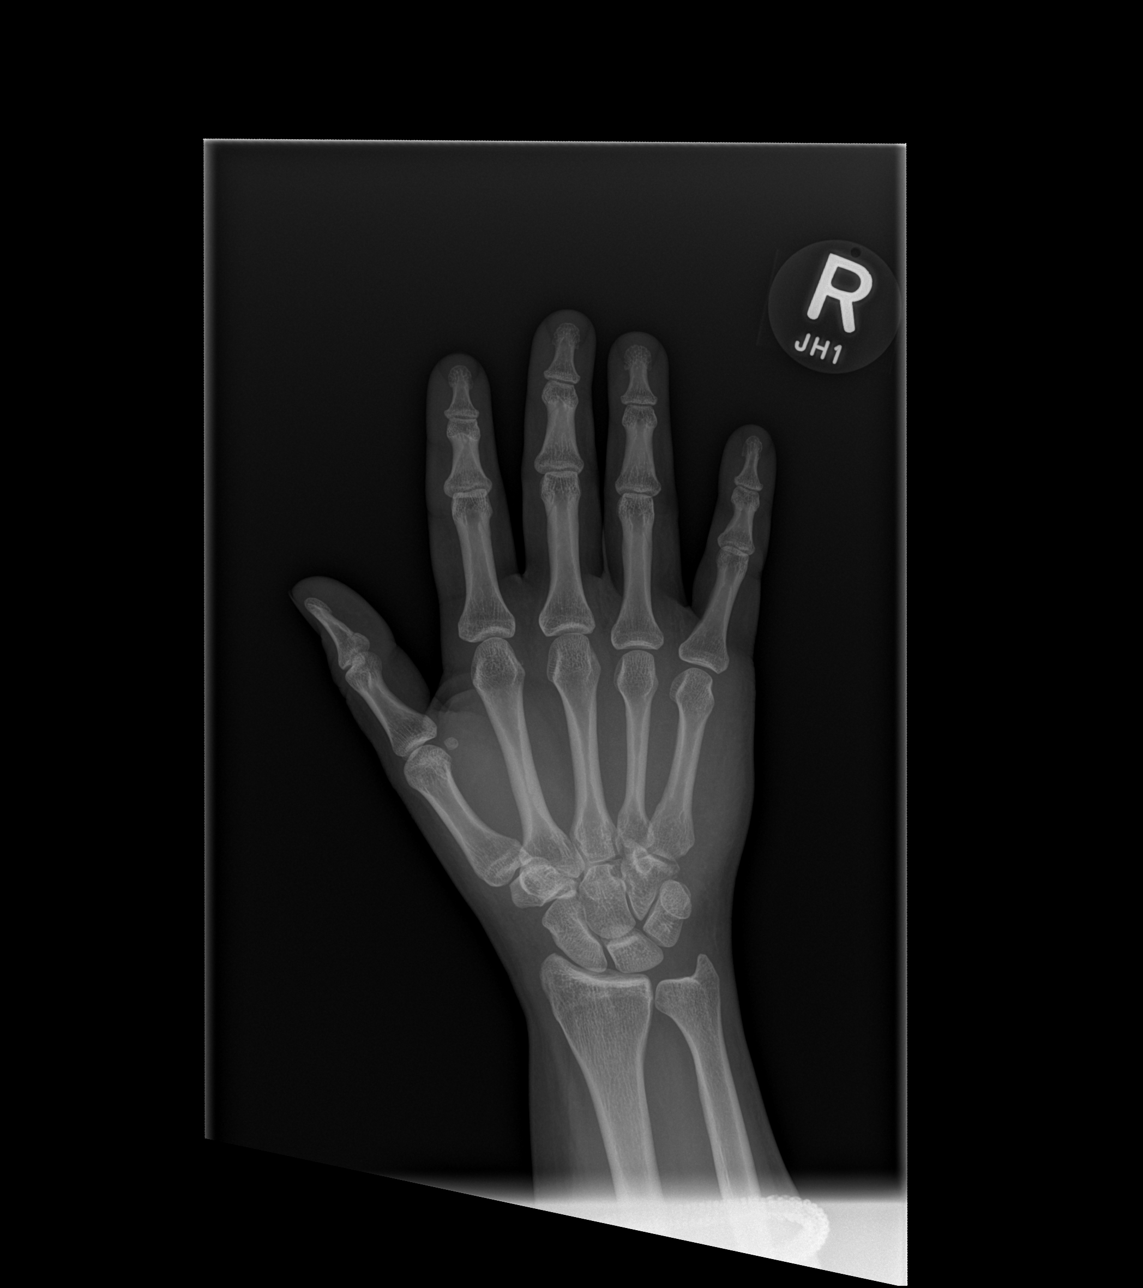

[x hand obl right]
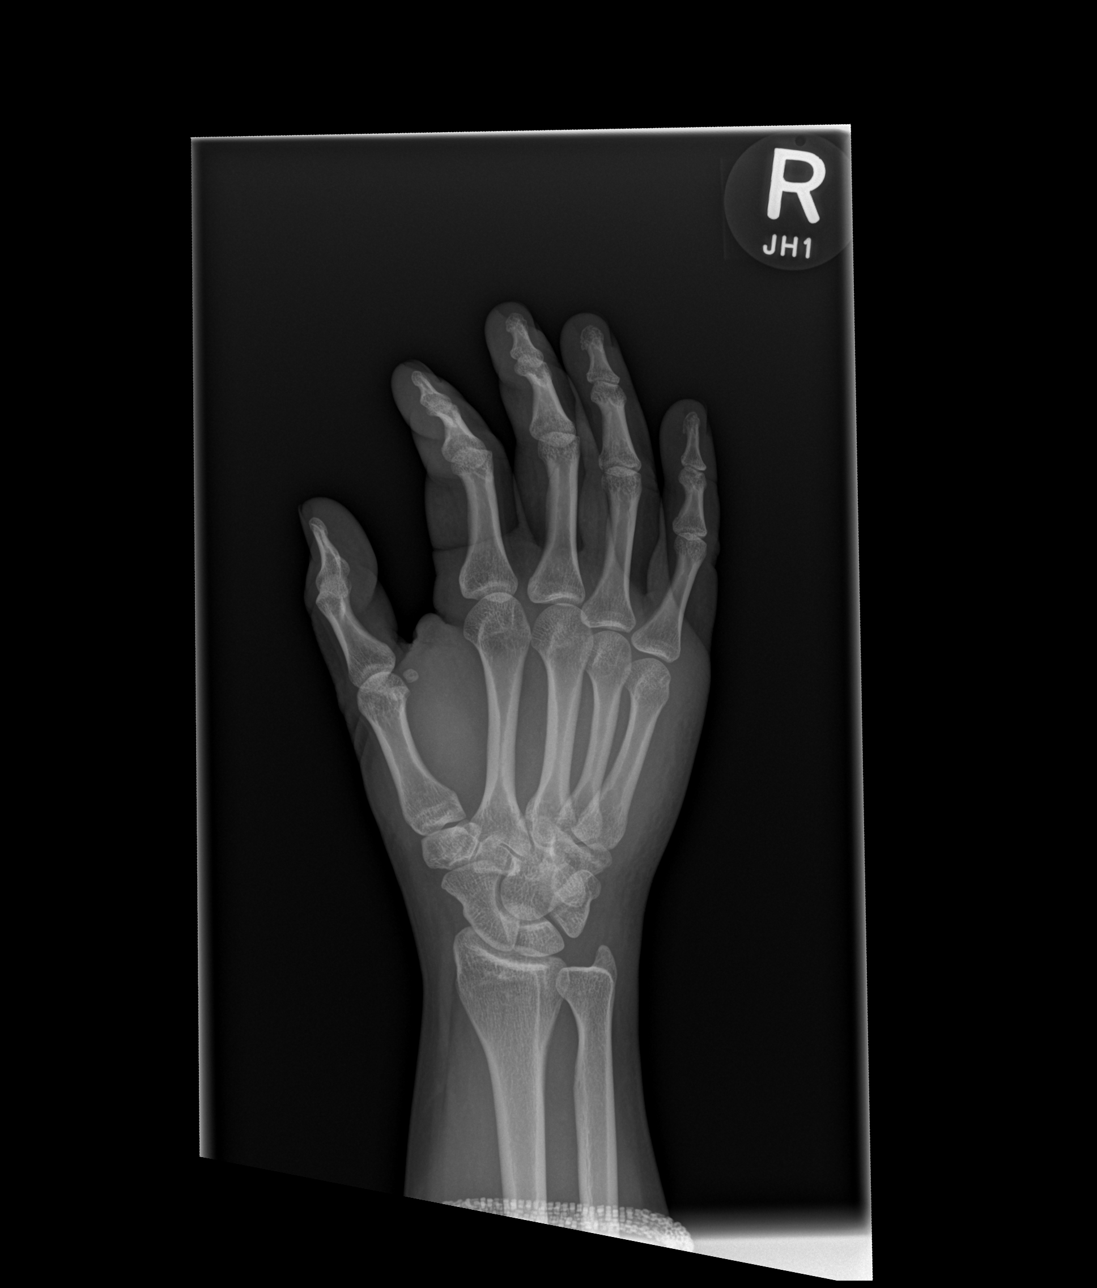

[x hand lat right]
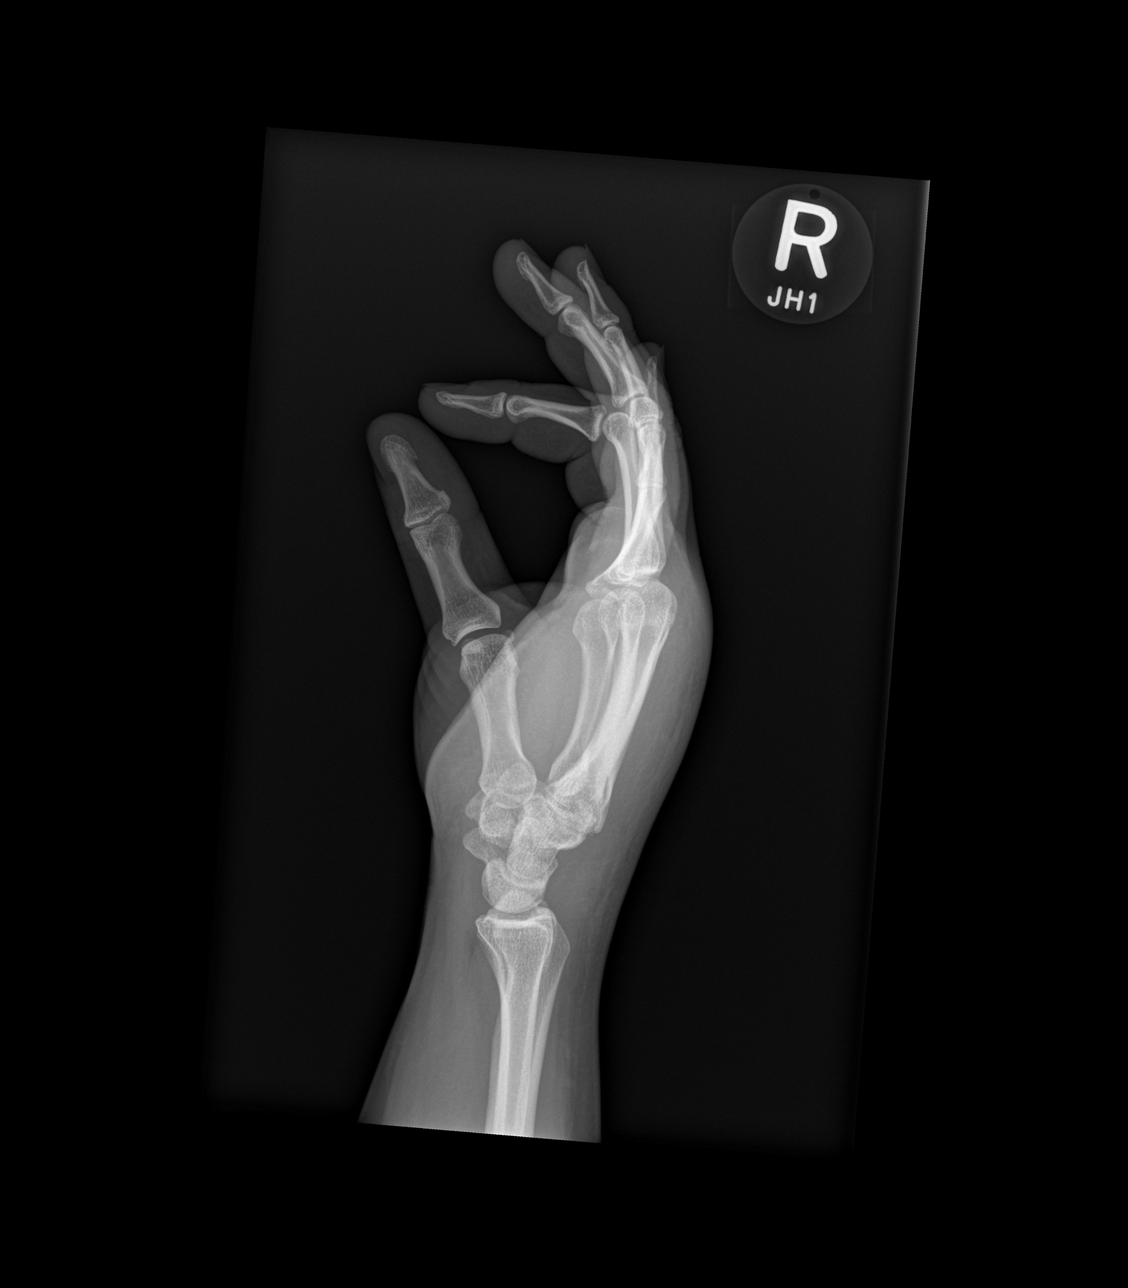

[3 of 3 positions shown; findings below may reference images not displayed]

FINDINGS: Tiny bony fragment in dorsum of metacarpal-carpal joint space only
seen on lateral radiograph. No donor site. No dislocation. There is
no evidence of arthropathy or other focal bone abnormality. Dorsal
hand soft tissue swelling without subcutaneous gas or radiopaque
foreign bodies.
IMPRESSION: Tiny possibly acute fracture fragment dorsum of wrist. No
dislocation.

## 2020-02-24 ENCOUNTER — Other Ambulatory Visit: Payer: Self-pay

## 2020-02-24 ENCOUNTER — Ambulatory Visit (HOSPITAL_COMMUNITY)
Admission: EM | Admit: 2020-02-24 | Discharge: 2020-02-24 | Disposition: A | Discharge status: Home / Self Care | Payer: BLUE CROSS/BLUE SHIELD | Attending: Family Medicine | Admitting: Family Medicine

## 2020-02-24 ENCOUNTER — Encounter (HOSPITAL_COMMUNITY): Payer: Self-pay

## 2020-02-24 DIAGNOSIS — T23231A Burn of second degree of multiple right fingers (nail), not including thumb, initial encounter: Secondary | ICD-10-CM

## 2020-02-24 MED ORDER — IBUPROFEN 800 MG PO TABS: 800.0000 mg | ORAL_TABLET | Freq: Three times a day (TID) | ORAL | 0 refills | Status: AC

## 2020-02-24 MED ORDER — SILVER SULFADIAZINE 1 % EX CREA: TOPICAL_CREAM | Freq: Once | CUTANEOUS | Status: AC

## 2020-02-24 MED ORDER — IBUPROFEN 800 MG PO TABS
800.0000 mg | ORAL_TABLET | Freq: Once | ORAL | Status: AC
  Administered 2020-02-24: 800 mg via ORAL

## 2020-02-24 MED ORDER — IBUPROFEN 800 MG PO TABS
ORAL_TABLET | ORAL | Status: AC
  Filled 2020-02-24: qty 1

## 2020-02-24 MED ORDER — SILVER SULFADIAZINE 1 % EX CREA: 1.0000 "application " | TOPICAL_CREAM | Freq: Every day | CUTANEOUS | 0 refills | Status: AC

## 2020-02-24 NOTE — Discharge Instructions (Signed)
Cleanse daily with soap and water, especially once the blisters open. Try to keep blisters intact as able.  Use of provided cream to prevent infection.  Keep covered to keep clean.  I do recommend following up with Dr. Mikey Bussing for management of your wounds.  Return for any signs of infection- increased pain, pus drainage Elevate to help with swelling, ibuprofen to help with pain and swelling.

## 2020-02-24 NOTE — ED Triage Notes (Signed)
Pt presents to UC for burns to right 3-5 fingers sustained at work in fryer. Pt noted to have large blisters on 3-4 fingers, and small blister on 5 finger. Pt endorsing "stinging and tightness". Pt has treated with ibuprofen, and burn cream. Blisters noted to be intact at this time.

## 2020-02-24 NOTE — ED Provider Notes (Signed)
[facility]    CSN: [id number] Arrival date & time: 02/24/20  1529      History   Chief Complaint Chief Complaint  Patient presents with   Hand Burn    HPI Kristen Collier is a 29 y.o. female.   Kristen Collier presents with complaints of burn to dorsum of right middle, ring and pinky finger. 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 Now with redness and blistering. No numbness or tingling. Mild swelling present. States she got a tdap this year. Blisters remain intact   ROS per HPI, negative if not otherwise mentioned.      History reviewed. No pertinent past medical history.  There are no problems to display for this patient.   History reviewed. No pertinent surgical history.  OB History   No obstetric history on file.      Home Medications    Prior to Admission medications   Medication Sig Start Date End Date Taking? Authorizing Provider  norethindrone-ethinyl estradiol (JUNEL FE,GILDESS FE,LOESTRIN FE) 1-20 MG-MCG tablet Take 1 tablet by mouth daily.   Yes [provider]  azithromycin (ZITHROMAX Z-PAK) 250 MG tablet 2 tabs po first day, then 1 tab po next 4 days 08/27/13   Sondra Barges, PA-C  HYDROcodone-homatropine [facility]) 5-1.5 MG/5ML syrup 1 TSP PO Q 4-6 HOURS PRN COUGH 08/27/13   Sondra Barges, PA-C  ibuprofen (ADVIL) 800 MG tablet Take 1 tablet (800 mg total) by mouth 3 (three) times daily. 02/24/20   Linus Mako B, NP  ipratropium (ATROVENT) 0.06 % nasal spray Place 2 sprays into the nose 3 (three) times daily. 08/27/13   Dunn, Raymon Mutton, PA-C  methocarbamol (ROBAXIN) 500 MG tablet Take 2 tablets (1,000 mg total) by mouth every 8 (eight) hours as needed for muscle spasms. 08/20/14   Loren Racer, MD  naproxen (NAPROSYN) 375 MG tablet Take 1 tablet (375 mg total) by mouth 2 (two) times daily with a meal. 07/16/17   Everlene Farrier, PA-C  silver sulfADIAZINE (SILVADENE) 1 % cream Apply  1 application topically daily. 02/24/20   Georgetta Haber, NP  traMADol (ULTRAM) 50 MG tablet Take 1 tablet (50 mg total) by mouth every 6 (six) hours as needed. 08/20/14   Loren Racer, MD    Family History Family History  Problem Relation Age of Onset   Heart disease Maternal Grandmother    Diabetes Maternal Grandmother    Heart disease Paternal Grandmother    Cancer Paternal Grandfather    Cancer Other     Social History Social History   Tobacco Use   Smoking status: Never Smoker   Smokeless tobacco: Never Used  Substance Use Topics   Alcohol use: Yes    Comment: 1-2 a week   Drug use: No     Allergies   Latex   Review of Systems Review of Systems   Physical Exam Triage Vital Signs ED Triage Vitals  Enc Vitals Group     BP 02/24/20 1559 123/88     Pulse Rate 02/24/20 1559 76     Resp 02/24/20 1559 16     Temp 02/24/20 1559 98.2 F (36.8 C)     Temp src --      SpO2 02/24/20 1559 100 %     Weight --      Height --      Head Circumference --      Peak Flow --  Pain Score 02/24/20 1601 8     Pain Loc --      Pain Edu? --      Excl. in GC? --    No data found.  Updated Vital Signs BP 123/88 (BP Location: Right Arm)    Pulse 76    Temp 98.2 F (36.8 C)    Resp 16    LMP 02/19/2020 (Exact Date)    SpO2 100%    Physical Exam Constitutional:      General: She is not in acute distress.    Appearance: She is well-developed.  Cardiovascular:     Rate and Rhythm: Normal rate.  Pulmonary:     Effort: Pulmonary effort is normal.  Skin:    General: Skin is warm and dry.     Comments: Redness and blistering noted to dorsum of right midline, ring and pinky finger with two larger blisters still intact to middle phalanx of ring and middle finger; no circumferential presence, only to dorsal aspect; cap refill < 2 seconds, sensation intact; see photo   Neurological:     Mental Status: She is alert and oriented to person, place, and time.         UC Treatments / Results  Labs (all labs ordered are listed, but only abnormal results are displayed) Labs Reviewed - No data to display  EKG   Radiology No results found.  Procedures Procedures (including critical care time)  Medications Ordered in UC Medications  silver sulfADIAZINE (SILVADENE) 1 % cream ( Topical Given 02/24/20 1703)  ibuprofen (ADVIL) tablet 800 mg (800 mg Oral Given 02/24/20 1703)    Initial Impression / Assessment and Plan / UC Course  I have reviewed the triage vital signs and the nursing notes.  Pertinent labs & imaging results that were available during my care of the patient were reviewed by me and considered in my medical decision making (see chart for details).     Non-circumferential burns to dorsum of right hand, blisters intact. Wound care provided and discussed. Recommend follow up with Dr. Mikey Bussing for recheck. Return precautions provided. Patient verbalized understanding and agreeable to plan.   Final Clinical Impressions(s) / UC Diagnoses   Final diagnoses:  Partial thickness burn of multiple fingers of right hand excluding thumb, initial encounter     Discharge Instructions     Cleanse daily with soap and water, especially once the blisters open. Try to keep blisters intact as able.  Use of provided cream to prevent infection.  Keep covered to keep clean.  I do recommend following up with Dr. Mikey Bussing for management of your wounds.  Return for any signs of infection- increased pain, pus drainage Elevate to help with swelling, ibuprofen to help with pain and swelling.    ED Prescriptions    Medication Sig Dispense Auth. Provider   silver sulfADIAZINE (SILVADENE) 1 % cream Apply 1 application topically daily. 50 g Linus Mako B, NP   ibuprofen (ADVIL) 800 MG tablet Take 1 tablet (800 mg total) by mouth 3 (three) times daily. 30 tablet Georgetta Haber, NP     PDMP not reviewed this encounter.   Georgetta Haber,  NP 02/24/20 2050

## 2020-08-17 ENCOUNTER — Other Ambulatory Visit: Payer: BLUE CROSS/BLUE SHIELD

## 2021-08-16 IMAGING — CR DG FINGER RING 2+V*L*
3 series · 3 of 3 positions shown · non-contrast
Comparison: None.

CLINICAL DATA: 28-year-old female with laceration and left ring
finger pain.

EXAM:
LEFT RING FINGER 2+V

[x finger pa left]
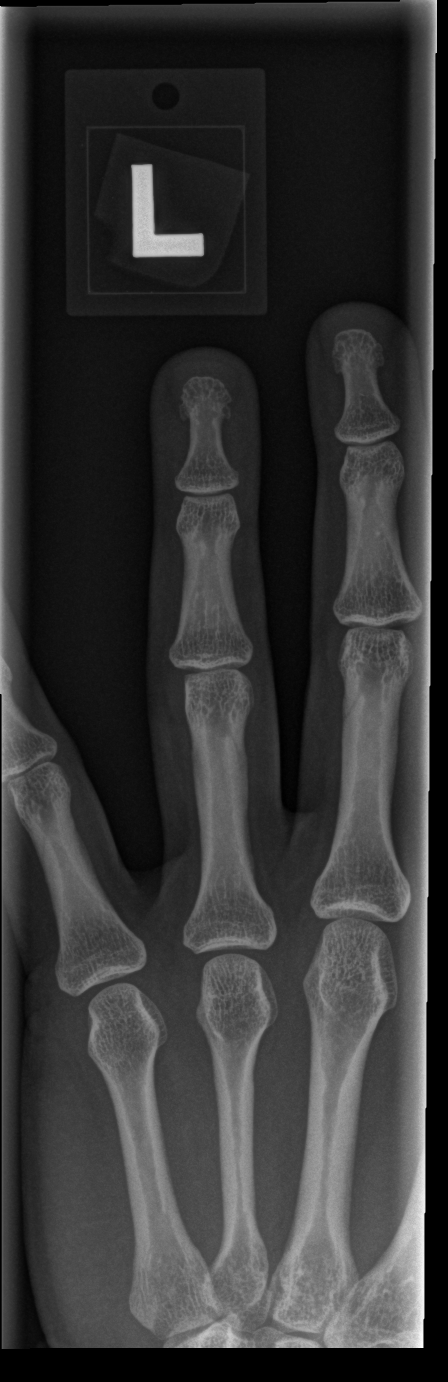

[x finger obl left]
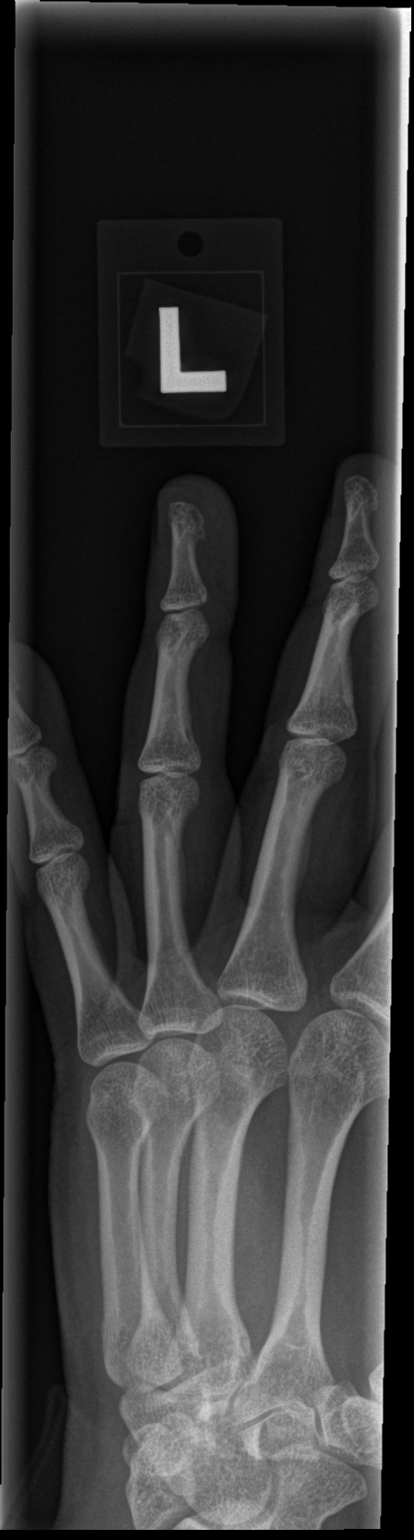

[x finger lat left]
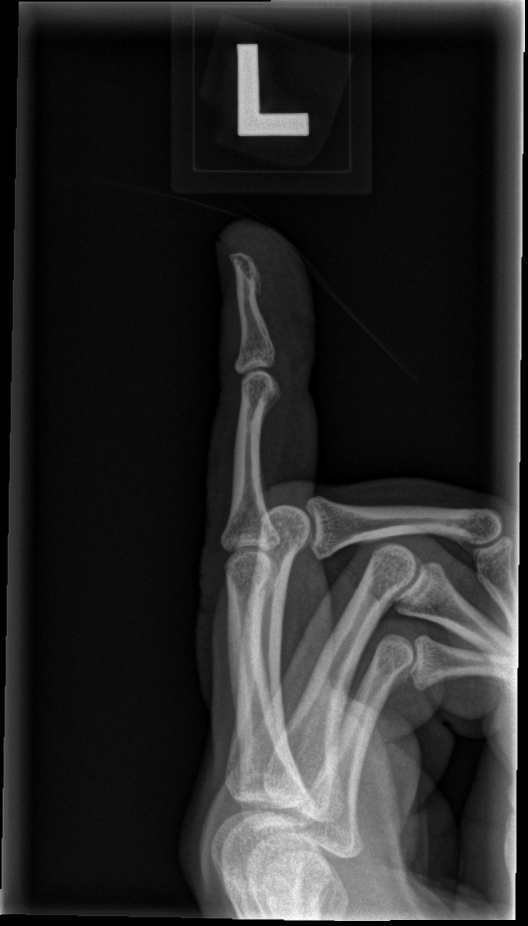

[3 of 3 positions shown; findings below may reference images not displayed]

FINDINGS: There is no acute fracture or dislocation. The bones are well
mineralized. No arthritic changes. Laceration of the volar aspect of
the distal fourth digit. No radiopaque foreign object or soft tissue
gas.
IMPRESSION: Negative.

## 2024-08-30 ENCOUNTER — Other Ambulatory Visit: Payer: Self-pay

## 2024-08-30 ENCOUNTER — Emergency Department (HOSPITAL_BASED_OUTPATIENT_CLINIC_OR_DEPARTMENT_OTHER)
Admission: EM | Admit: 2024-08-30 | Discharge: 2024-08-30 | Discharge status: Absent without leave | Payer: Self-pay | Source: Home / Self Care
